# Patient Record
Sex: Female | Born: 1955 | Race: White | Hispanic: No | Marital: Single | Smoking: Former smoker
Health system: Southern US, Community
[De-identification: ages and names within clinical notes are randomized; demographics above are authoritative.]

## PROBLEM LIST (undated history)

## (undated) DIAGNOSIS — E119 Type 2 diabetes mellitus without complications: Secondary | ICD-10-CM

## (undated) DIAGNOSIS — F419 Anxiety disorder, unspecified: Secondary | ICD-10-CM

## (undated) DIAGNOSIS — E785 Hyperlipidemia, unspecified: Secondary | ICD-10-CM

## (undated) DIAGNOSIS — T7840XA Allergy, unspecified, initial encounter: Secondary | ICD-10-CM

## (undated) HISTORY — DX: Hyperlipidemia, unspecified: E78.5

## (undated) HISTORY — DX: Type 2 diabetes mellitus without complications: E11.9

## (undated) HISTORY — PX: COLONOSCOPY: SHX5424

## (undated) HISTORY — DX: Allergy, unspecified, initial encounter: T78.40XA

## (undated) HISTORY — DX: Anxiety disorder, unspecified: F41.9

## (undated) HISTORY — PX: WISDOM TOOTH EXTRACTION: SHX21

---
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
## 1999-12-17 ENCOUNTER — Encounter: Admission: RE | Admit: 1999-12-17 | Discharge: 1999-12-17 | Payer: Self-pay | Admitting: Obstetrics and Gynecology

## 1999-12-17 ENCOUNTER — Encounter: Payer: Self-pay | Admitting: Obstetrics and Gynecology

## 2000-07-31 ENCOUNTER — Inpatient Hospital Stay (HOSPITAL_COMMUNITY): Admission: AD | Admit: 2000-07-31 | Discharge: 2000-07-31 | Payer: Self-pay | Admitting: Obstetrics and Gynecology

## 2003-02-15 ENCOUNTER — Encounter: Payer: Self-pay | Admitting: Obstetrics and Gynecology

## 2003-02-15 ENCOUNTER — Encounter: Admission: RE | Admit: 2003-02-15 | Discharge: 2003-02-15 | Payer: Self-pay | Admitting: Obstetrics and Gynecology

## 2004-02-28 ENCOUNTER — Encounter: Admission: RE | Admit: 2004-02-28 | Discharge: 2004-02-28 | Payer: Self-pay | Admitting: Obstetrics and Gynecology

## 2006-06-15 ENCOUNTER — Encounter: Admission: RE | Admit: 2006-06-15 | Discharge: 2006-06-15 | Payer: Self-pay | Admitting: Obstetrics and Gynecology

## 2007-10-26 ENCOUNTER — Encounter: Admission: RE | Admit: 2007-10-26 | Discharge: 2007-10-26 | Payer: Self-pay | Admitting: Obstetrics and Gynecology

## 2009-07-22 IMAGING — MG MM SCREEN MAMMOGRAM BILATERAL
4 series · 4 of 4 positions shown · non-contrast
Comparison: none

DG SCREEN MAMMOGRAM BILATERAL
Bilateral CC and MLO view(s) were taken.

DIGITAL SCREENING MAMMOGRAM WITH CAD:
There are scattered fibroglandular densities.  No masses or malignant type calcifications are 
identified.  Compared with prior studies.

[R CC]
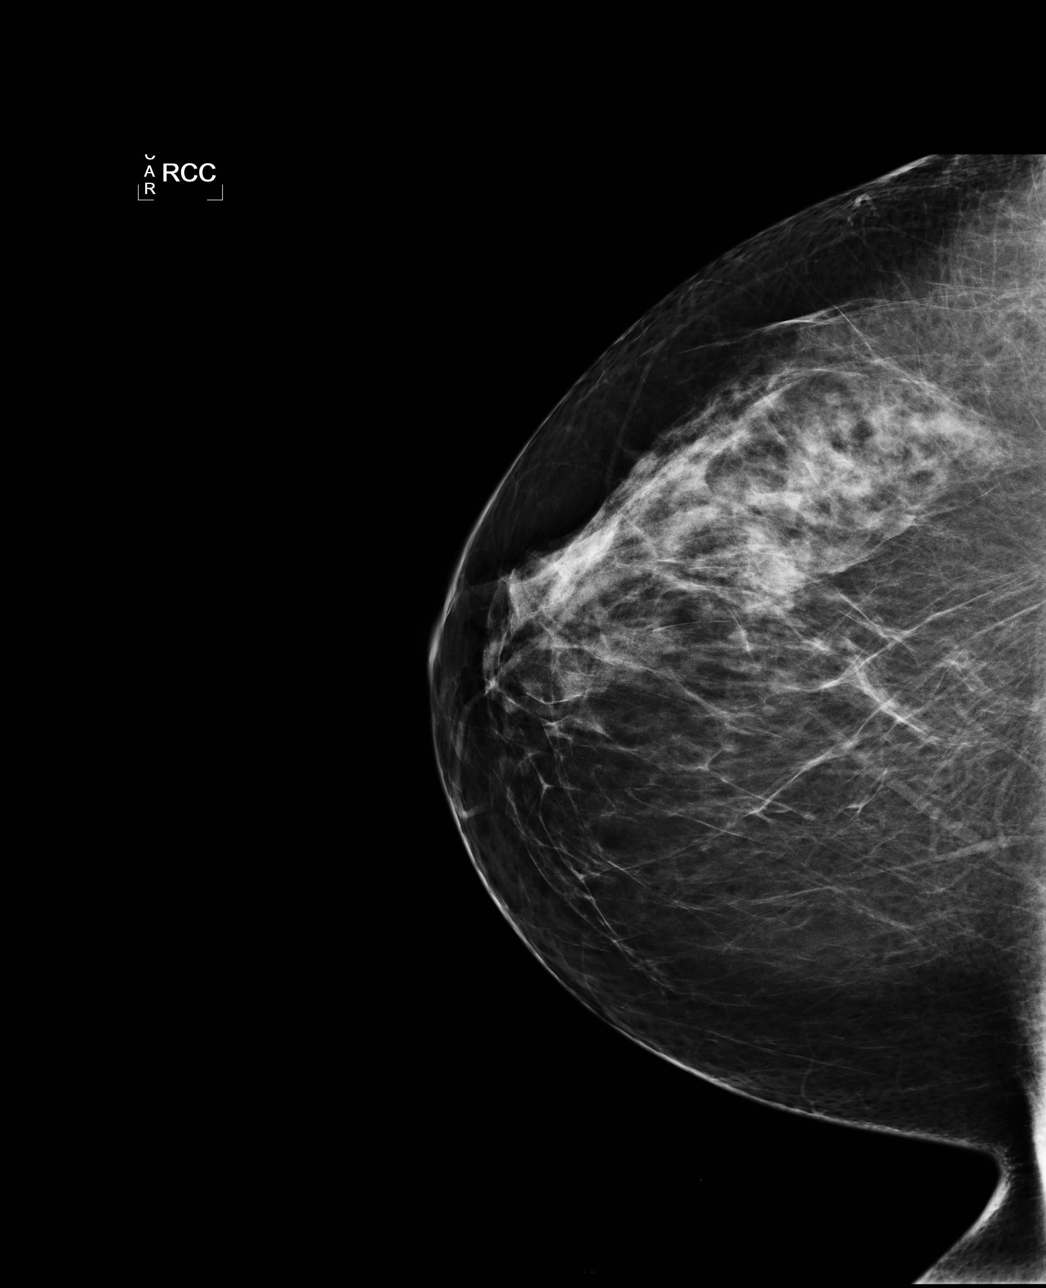

[L CC]
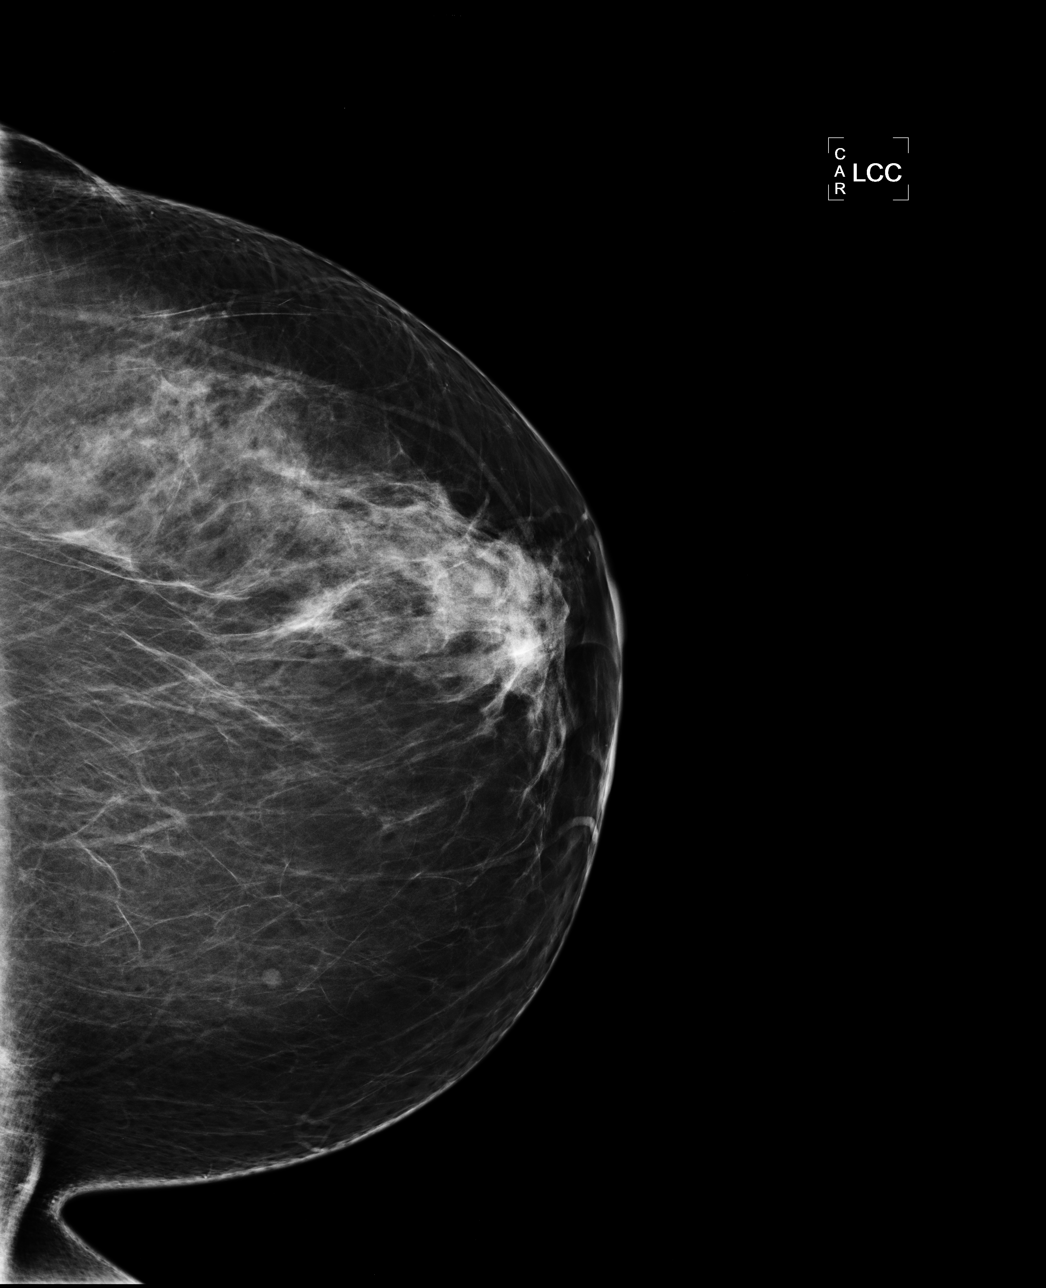

[L MLO]
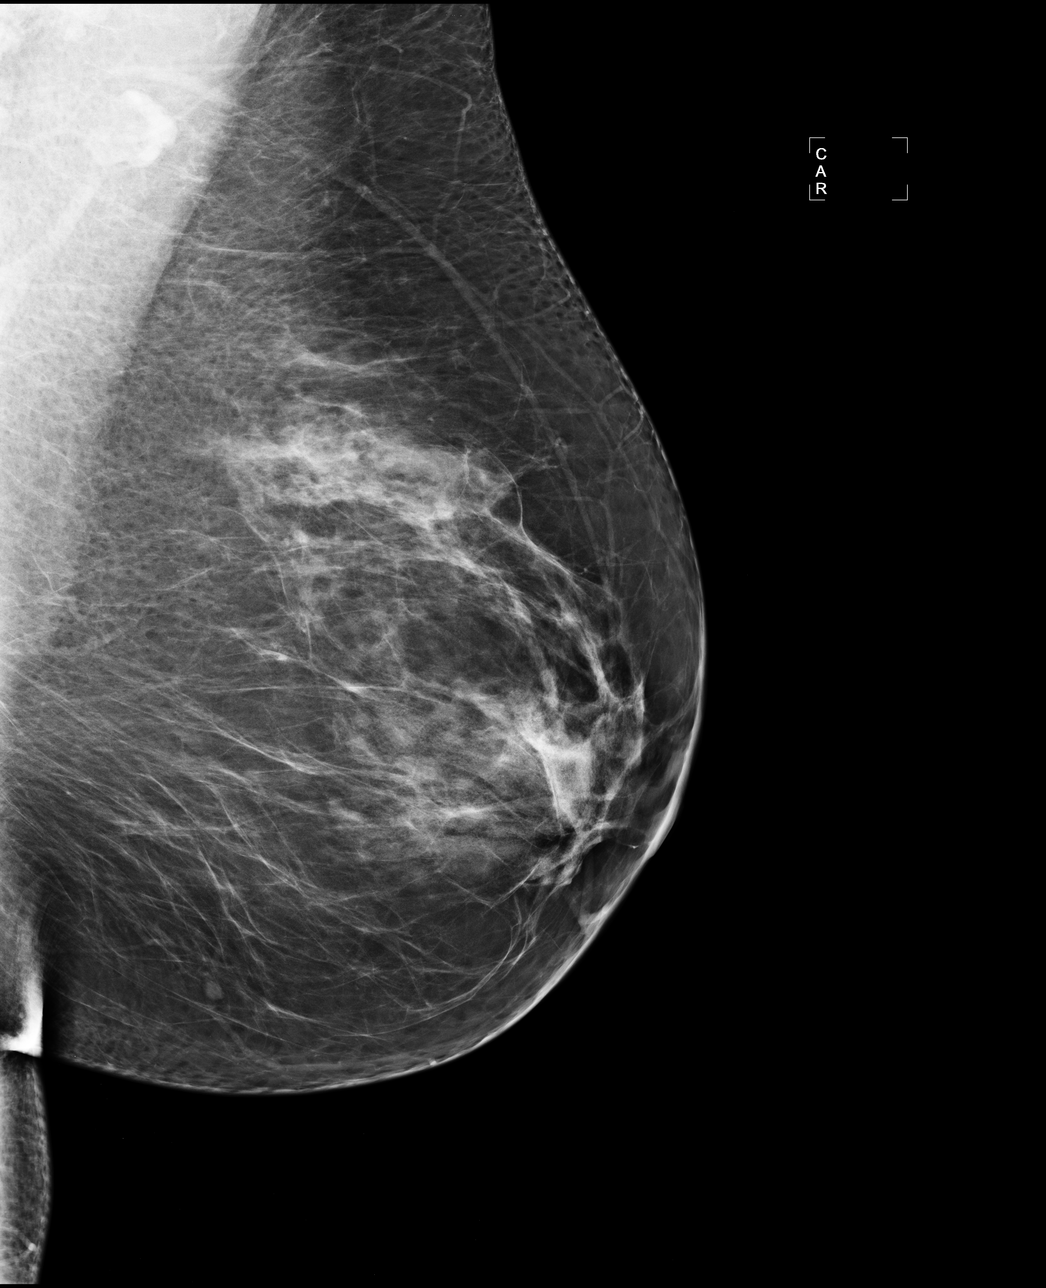

[R MLO]
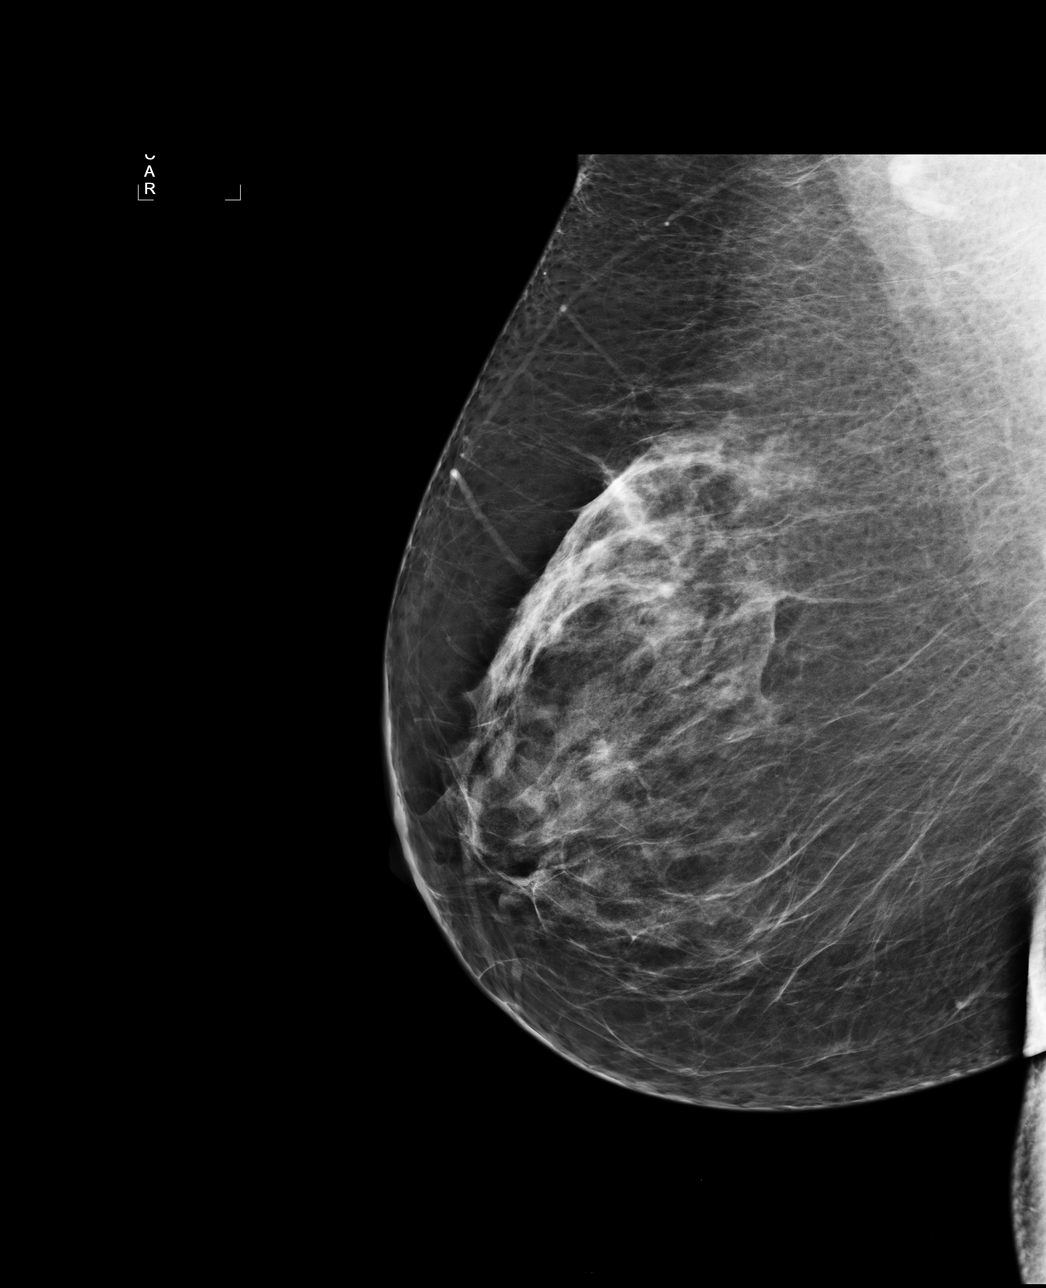

[4 of 4 positions shown; findings below may reference images not displayed]

IMPRESSION: No specific mammographic evidence of malignancy.  Next screening mammogram is recommended in one 
year.

ASSESSMENT: Negative - BI-RADS 1

Screening mammogram in 1 year.
ANALYZED BY COMPUTER AIDED DETECTION. , THIS PROCEDURE WAS A DIGITAL MAMMOGRAM.

## 2012-07-13 ENCOUNTER — Ambulatory Visit (AMBULATORY_SURGERY_CENTER): Payer: 59 | Admitting: *Deleted

## 2012-07-13 VITALS — Ht 63.0 in | Wt 191.7 lb

## 2012-07-13 DIAGNOSIS — Z1211 Encounter for screening for malignant neoplasm of colon: Secondary | ICD-10-CM

## 2012-07-13 MED ORDER — MOVIPREP 100 G PO SOLR: 1.0000 | Freq: Once | ORAL | Status: DC

## 2012-07-19 ENCOUNTER — Ambulatory Visit (AMBULATORY_SURGERY_CENTER): Payer: 59 | Admitting: Gastroenterology

## 2012-07-19 ENCOUNTER — Encounter: Payer: Self-pay | Admitting: Gastroenterology

## 2012-07-19 VITALS — BP 115/74 | HR 63 | Temp 98.4°F | Resp 15 | Ht 63.0 in | Wt 191.0 lb

## 2012-07-19 DIAGNOSIS — Z1282 Encounter for screening for malignant neoplasm of nervous system: Secondary | ICD-10-CM

## 2012-07-19 DIAGNOSIS — K5730 Diverticulosis of large intestine without perforation or abscess without bleeding: Secondary | ICD-10-CM

## 2012-07-19 MED ORDER — SODIUM CHLORIDE 0.9 % IV SOLN: 500.0000 mL | INTRAVENOUS | Status: DC

## 2012-07-19 NOTE — Op Note (Signed)
[facility] [address] [place], [address]   COLONOSCOPY PROCEDURE REPORT  PATIENT: Theresa Crane, Theresa Crane  MR#: [id number] BIRTHDATE: Feb 17, 1956 , 56  yrs. old GENDER: Female ENDOSCOPIST: Rachael Fee, MD REFERRED [id number] Vincente Poli, M.D. PROCEDURE DATE:  07/19/2012 PROCEDURE:   Colonoscopy, diagnostic ASA CLASS:   Class II INDICATIONS:average risk screening. MEDICATIONS: Fentanyl 50 mcg IV, Versed 7 mg IV, and These medications were titrated to patient response per physician's verbal order  DESCRIPTION OF PROCEDURE:   After the risks benefits and alternatives of the procedure were thoroughly explained, informed consent was obtained.  A digital rectal exam revealed no abnormalities of the rectum.   The LB [id number]-[id number]  endoscope was introduced through the anus and advanced to the cecum, which was identified by both the appendix and ileocecal valve. No adverse events experienced.   The quality of the prep was good, using MoviPrep  The instrument was then slowly withdrawn as the colon was fully examined.    COLON FINDINGS: There were multiple diverticulum throughout the colon.  The examination was otherwise normal.  Retroflexed views revealed no abnormalities. The time to cecum=3 minutes 00 seconds. Withdrawal time=10 minutes 35 seconds.  The scope was withdrawn and the procedure completed. COMPLICATIONS: There were no complications.  ENDOSCOPIC IMPRESSION: There were multiple diverticulum throughout the colon. The examination was otherwise normal.  No polyps or cancers.  RECOMMENDATIONS: You should continue to follow colorectal cancer screening guidelines for "routine risk" patients with a repeat colonoscopy in 10 years. There is no need for FOBT (stool) testing for at least 5 years.   eSigned:  Rachael Fee, MD 07/19/2012 2:01 PM

## 2012-07-19 NOTE — Progress Notes (Signed)
Patient did not experience any of the following events: a burn prior to discharge; a fall within the facility; wrong site/side/patient/procedure/implant event; or a hospital transfer or hospital admission upon discharge from the facility. (G8907) Patient did not have preoperative order for IV antibiotic SSI prophylaxis. (G8918)  

## 2012-07-19 NOTE — Patient Instructions (Addendum)
YOU HAD AN ENDOSCOPIC PROCEDURE TODAY AT THE [facility]: Refer to the procedure report that was given to you for any specific questions about what was found during the examination.  If the procedure report does not answer your questions, please call your gastroenterologist to clarify.  If you requested that your care partner not be given the details of your procedure findings, then the procedure report has been included in a sealed envelope for you to review at your convenience later.  YOU SHOULD EXPECT: Some feelings of bloating in the abdomen. Passage of more gas than usual.  Walking can help get rid of the air that was put into your GI tract during the procedure and reduce the bloating. If you had a lower endoscopy (such as a colonoscopy or flexible sigmoidoscopy) you may notice spotting of blood in your stool or on the toilet paper. If you underwent a bowel prep for your procedure, then you may not have a normal bowel movement for a few days.  DIET: Your first meal following the procedure should be a light meal and then it is ok to progress to your normal diet.  A half-sandwich or bowl of soup is an example of a good first meal.  Heavy or fried foods are harder to digest and may make you feel nauseous or bloated.  Likewise meals heavy in dairy and vegetables can cause extra gas to form and this can also increase the bloating.  Drink plenty of fluids but you should avoid alcoholic beverages for 24 hours.  ACTIVITY: Your care partner should take you home directly after the procedure.  You should plan to take it easy, moving slowly for the rest of the day.  You can resume normal activity the day after the procedure however you should NOT DRIVE or use heavy machinery for 24 hours (because of the sedation medicines used during the test).    SYMPTOMS TO REPORT IMMEDIATELY: A gastroenterologist can be reached at any hour.  During normal business hours, 8:30 AM to 5:00 PM Monday through Friday,  call [phone].  After hours and on weekends, please call the GI answering service at [phone] who will take a message and have the physician on call contact you.   Following lower endoscopy (colonoscopy or flexible sigmoidoscopy):  Excessive amounts of blood in the stool  Significant tenderness or worsening of abdominal pains  Swelling of the abdomen that is new, acute  Fever of 100F or higher   FOLLOW UP: If any biopsies were taken you will be contacted by phone or by letter within the next 1-3 weeks.  Call your gastroenterologist if you have not heard about the biopsies in 3 weeks.  Our staff will call the home number listed on your records the next business day following your procedure to check on you and address any questions or concerns that you may have at that time regarding the information given to you following your procedure. This is a courtesy call and so if there is no answer at the home number and we have not heard from you through the emergency physician on call, we will assume that you have returned to your regular daily activities without incident.  SIGNATURES/CONFIDENTIALITY: You and/or your care partner have signed paperwork which will be entered into your electronic medical record.  These signatures attest to the fact that that the information above on your After Visit Summary has been reviewed and is understood.  Full responsibility of the confidentiality of   this discharge information lies with you and/or your care-partner.   Resume medications. Information given on diverticulosis and high fiber with discharge instructions. 

## 2012-07-20 ENCOUNTER — Telehealth: Payer: Self-pay | Admitting: *Deleted

## 2012-07-20 NOTE — Telephone Encounter (Signed)
  Follow up Call-  Call back number 07/19/2012  Post procedure Call Back phone  # [phone]  Permission to leave phone message Yes     Left message on answering machine to call us back if experiencing problems or has any questions.

## 2016-01-15 ENCOUNTER — Ambulatory Visit: Payer: 59 | Admitting: *Deleted

## 2017-08-10 ENCOUNTER — Ambulatory Visit: Payer: 59 | Admitting: Endocrinology

## 2018-03-03 ENCOUNTER — Ambulatory Visit: Payer: BLUE CROSS/BLUE SHIELD | Admitting: Podiatry

## 2018-03-03 ENCOUNTER — Encounter: Payer: Self-pay | Admitting: Podiatry

## 2018-03-03 ENCOUNTER — Ambulatory Visit (INDEPENDENT_AMBULATORY_CARE_PROVIDER_SITE_OTHER): Payer: BLUE CROSS/BLUE SHIELD

## 2018-03-03 DIAGNOSIS — M7751 Other enthesopathy of right foot: Secondary | ICD-10-CM | POA: Diagnosis not present

## 2018-03-03 DIAGNOSIS — M779 Enthesopathy, unspecified: Secondary | ICD-10-CM

## 2018-03-03 DIAGNOSIS — M7752 Other enthesopathy of left foot: Secondary | ICD-10-CM | POA: Diagnosis not present

## 2018-03-03 DIAGNOSIS — M898X9 Other specified disorders of bone, unspecified site: Secondary | ICD-10-CM

## 2018-03-03 DIAGNOSIS — E119 Type 2 diabetes mellitus without complications: Secondary | ICD-10-CM | POA: Insufficient documentation

## 2018-03-03 DIAGNOSIS — E559 Vitamin D deficiency, unspecified: Secondary | ICD-10-CM | POA: Insufficient documentation

## 2018-03-03 MED ORDER — MELOXICAM 7.5 MG PO TABS: 7.5000 mg | ORAL_TABLET | Freq: Every day | ORAL | 0 refills | Status: DC

## 2018-03-03 NOTE — Patient Instructions (Signed)
You can use "fungi-nail" on your toenails  Look at getting a "powerstep" or "superfeet" insert

## 2018-03-03 NOTE — Progress Notes (Signed)
Subjective:    Patient ID: Theresa Crane, female    DOB: 1956-07-29, 62 y.o.   MRN: [id number]  HPI Ms. Tiburcio PeaHarris presents the office today for concerns of pain to the also aspect of the right foot.  She states that she has had some pain to the area for the last 2 months.  She states that she feels that there is been something sliding in the area and she feels that she may have broken something previously.  She states that hurts with certain shoes and pressure.  She has noticed some mild swelling to the outside aspect of the right foot.  Denies any redness or warmth.  She does have a history of a remote ankle injury in the left side which will occasionally cause some discomfort.  She states when she wears a shoe with good arch support she does not have pain.   Review of Systems  All other systems reviewed and are negative.  Past Medical History:  Diagnosis Date  . Anxiety   . Diabetes mellitus without complication (HCC)   . Hyperlipidemia     Past Surgical History:  Procedure Laterality Date  . eye operation  1962  . WISDOM TOOTH EXTRACTION       Current Outpatient Medications:  .  lisinopril (PRINIVIL,ZESTRIL) 5 MG tablet, Take 5 mg by mouth daily., Disp: , Rfl:  .  meloxicam (MOBIC) 7.5 MG tablet, Take 1 tablet (7.5 mg total) by mouth daily., Disp: 30 tablet, Rfl: 0 .  metFORMIN (GLUCOPHAGE) 500 MG tablet, Take 500 mg by mouth 2 (two) times daily with a meal., Disp: , Rfl:  .  Multiple Vitamins-Minerals (MULTIVITAMIN PO), Take 1 capsule by mouth daily., Disp: , Rfl:  .  norethindrone-ethinyl estradiol (TRIPHASIL,CYCLAFEM,ALYACEN) 0.5/0.75/1-35 MG-MCG tablet, Take 1 tablet by mouth daily., Disp: , Rfl:  .  rosuvastatin (CRESTOR) 20 MG tablet, Take 20 mg by mouth daily., Disp: , Rfl:  .  sertraline (ZOLOFT) 100 MG tablet, Take 100 mg by mouth daily., Disp: , Rfl:   No Known Allergies      Objective:   Physical Exam  General: AAO x3, NAD  Dermatological: Bilateral hallux  toenails are hypertrophic and mildly dystrophic with yellow-brown discoloration but there is no pain in the nails or any redness or drainage.  There are no open sores, no preulcerative lesions, no rash or signs of infection present.  Vascular: Dorsalis Pedis artery and Posterior Tibial artery pedal pulses are 2/4 bilateral with immedate capillary fill time.There is no pain with calf compression, swelling, warmth, erythema.   Neruologic: Grossly intact via light touch bilateral. Vibratory intact via tuning fork bilateral. Protective threshold with Semmes Wienstein monofilament intact to all pedal sites bilateral.   Musculoskeletal: On the base of the right fifth metatarsal laterally is a bony elements but there is localized edema without any erythema or increase in warmth.  There is no significant discomfort on the course the peroneal tendon.  There is no pain to vibratory sensation.  No other areas of tenderness on the right foot.  Range of motion intact.  On the left ankle there is some bony prominence along the medial aspect of the ankle where she occasionally gets some discomfort but currently denies any pain.  There is no edema, erythema, increase in warmth.  Muscular strength 5/5 in all groups tested bilateral.  Gait: Unassisted, Nonantalgic.      Assessment & Plan:  62 year old female with right fifth metatarsal bony exostosis, tendinitis ; onychomycosis -Treatment  options discussed including all alternatives, risks, and complications -Etiology of symptoms were discussed -X-rays were obtained and reviewed with the patient.  On the lateral aspect the fifth metatarsal base is a bony exostosis likely from a prior injury.  This corresponds to her area pain.  There is no other evidence of acute fracture identified bilaterally. -Discussed a steroid injection but she declines this. -We discussed the change in shoes and wearing good arch supports if this is helpful. -Prescribed mobic. Discussed side  effects of the medication and directed to stop if any are to occur and call the office.  -Ice daily -Discuss Fungi-Nail for toenails.  Also discussed other options and will start with over-the-counter medicine topical medication. -Follow-up in 6-8 weeks if symptoms continue or sooner if any issues are to arise.   Vivi Barrack DPM

## 2018-03-04 ENCOUNTER — Telehealth: Payer: Self-pay | Admitting: Podiatry

## 2018-03-04 NOTE — Telephone Encounter (Signed)
Pt is wondering if she can get a boot or something. She is in a lot of pain and needs something to take the pressure.

## 2018-03-04 NOTE — Telephone Encounter (Signed)
Left message for pt to pick up a surgery sandal tomorrow at 8:00am, and we would bill her insurance, and if she wanted to wait until Monday she could wear a stiff bottom shoe with open toes to relieve any pressure or rubbing.

## 2018-04-14 ENCOUNTER — Other Ambulatory Visit: Payer: Self-pay | Admitting: Podiatry

## 2018-05-13 ENCOUNTER — Other Ambulatory Visit: Payer: Self-pay | Admitting: Podiatry

## 2019-12-21 ENCOUNTER — Ambulatory Visit: Payer: Self-pay | Admitting: Gynecologic Oncology

## 2020-01-05 ENCOUNTER — Ambulatory Visit: Payer: Self-pay | Admitting: Gynecologic Oncology

## 2023-01-15 ENCOUNTER — Encounter: Payer: Self-pay | Admitting: Internal Medicine

## 2023-02-03 ENCOUNTER — Telehealth: Payer: Self-pay | Admitting: Internal Medicine

## 2023-02-03 NOTE — Telephone Encounter (Signed)
Inbound call from patient to let us know she wont be able to take the phone call at 2:00 ...  Appointment was reschedule.

## 2023-02-03 NOTE — Telephone Encounter (Signed)
Thanks. Ill make [facility] aware.

## 2023-02-11 ENCOUNTER — Other Ambulatory Visit: Payer: Self-pay

## 2023-02-11 ENCOUNTER — Ambulatory Visit: Payer: No Typology Code available for payment source

## 2023-02-11 VITALS — Ht 63.0 in | Wt 185.0 lb

## 2023-02-11 DIAGNOSIS — Z1211 Encounter for screening for malignant neoplasm of colon: Secondary | ICD-10-CM

## 2023-02-11 MED ORDER — NA SULFATE-K SULFATE-MG SULF 17.5-3.13-1.6 GM/177ML PO SOLN
1.0000 | Freq: Once | ORAL | 0 refills | Status: AC
Start: 2023-02-11 — End: 2023-02-11

## 2023-02-11 NOTE — Progress Notes (Signed)
Denies allergies to eggs or soy products. Denies complication of anesthesia or sedation. Denies use of weight loss medication. Denies use of O2.   Emmi instructions given for colonoscopy.  

## 2023-02-18 ENCOUNTER — Encounter: Payer: Self-pay | Admitting: Internal Medicine

## 2023-02-25 ENCOUNTER — Encounter: Payer: Self-pay | Admitting: Certified Registered Nurse Anesthetist

## 2023-03-03 ENCOUNTER — Ambulatory Visit: Payer: No Typology Code available for payment source | Admitting: Internal Medicine

## 2023-03-03 ENCOUNTER — Encounter: Payer: Self-pay | Admitting: Internal Medicine

## 2023-03-03 VITALS — BP 105/69 | HR 74 | Temp 98.4°F | Resp 12 | Ht 63.0 in | Wt 185.0 lb

## 2023-03-03 DIAGNOSIS — Z1211 Encounter for screening for malignant neoplasm of colon: Secondary | ICD-10-CM | POA: Diagnosis present

## 2023-03-03 MED ORDER — SODIUM CHLORIDE 0.9 % IV SOLN
500.0000 mL | Freq: Once | INTRAVENOUS | Status: DC
Start: 2023-03-03 — End: 2023-03-03

## 2023-03-03 NOTE — Patient Instructions (Addendum)
I am pleased to report that no polyps or cancer were seen.  You still have diverticulosis - thickened muscle rings and pouches in the colon wall. Please read the handout about this condition.  Next routine colonoscopy or other screening test in 10 years - 2034.  I appreciate the opportunity to care for you. Iva Boop, MD, [facility]  Handouts Provided:  Diverticulosis  YOU HAD AN ENDOSCOPIC PROCEDURE TODAY AT THE [facility]:   Refer to the procedure report that was given to you for any specific questions about what was found during the examination.  If the procedure report does not answer your questions, please call your gastroenterologist to clarify.  If you requested that your care partner not be given the details of your procedure findings, then the procedure report has been included in a sealed envelope for you to review at your convenience later.  YOU SHOULD EXPECT: Some feelings of bloating in the abdomen. Passage of more gas than usual.  Walking can help get rid of the air that was put into your GI tract during the procedure and reduce the bloating. If you had a lower endoscopy (such as a colonoscopy or flexible sigmoidoscopy) you may notice spotting of blood in your stool or on the toilet paper. If you underwent a bowel prep for your procedure, you may not have a normal bowel movement for a few days.  Please Note:  You might notice some irritation and congestion in your nose or some drainage.  This is from the oxygen used during your procedure.  There is no need for concern and it should clear up in a day or so.  SYMPTOMS TO REPORT IMMEDIATELY:  Following lower endoscopy (colonoscopy or flexible sigmoidoscopy):  Excessive amounts of blood in the stool  Significant tenderness or worsening of abdominal pains  Swelling of the abdomen that is new, acute  Fever of 100F or higher  For urgent or emergent issues, a gastroenterologist can be reached at any hour by calling [phone]  [phone]. Do not use MyChart messaging for urgent concerns.    DIET:  We do recommend a small meal at first, but then you may proceed to your regular diet.  Drink plenty of fluids but you should avoid alcoholic beverages for 24 hours.  ACTIVITY:  You should plan to take it easy for the rest of today and you should NOT DRIVE or use heavy machinery until tomorrow (because of the sedation medicines used during the test).    FOLLOW UP: Our staff will call the number listed on your records the next business day following your procedure.  We will call around 7:15- 8:00 am to check on you and address any questions or concerns that you may have regarding the information given to you following your procedure. If we do not reach you, we will leave a message.     If any biopsies were taken you will be contacted by phone or by letter within the next 1-3 weeks.  Please call us at [phone] if you have not heard about the biopsies in 3 weeks.    SIGNATURES/CONFIDENTIALITY: You and/or your care partner have signed paperwork which will be entered into your electronic medical record.  These signatures attest to the fact that that the information above on your After Visit Summary has been reviewed and is understood.  Full responsibility of the confidentiality of this discharge information lies with you and/or your care-partner.

## 2023-03-03 NOTE — Progress Notes (Signed)
[facility] History and Physical   Primary Care Physician:  Marcelle Overlie, MD   Reason for Procedure:   CRCa screening  Plan:    colonoscopy     HPI: Theresa Crane is a 67 y.o. female here for repeat screening exam, negative colonoscopy 2013.   Past Medical History:  Diagnosis Date   Allergy    Anxiety    Diabetes mellitus without complication (HCC)    Hyperlipidemia     Past Surgical History:  Procedure Laterality Date   eye operation  1962   WISDOM TOOTH EXTRACTION      Prior to Admission medications   Medication Sig Start Date End Date Taking? Authorizing Provider  empagliflozin (JARDIANCE) 25 MG TABS tablet Take 25 mg by mouth daily.   Yes [provider]  lisinopril (PRINIVIL,ZESTRIL) 5 MG tablet Take 5 mg by mouth daily.   Yes [provider]  metFORMIN (GLUCOPHAGE) 500 MG tablet Take 500 mg by mouth 2 (two) times daily with a meal.   Yes [provider]  pioglitazone (ACTOS) 15 MG tablet Take 15 mg by mouth daily.   Yes [provider]  rosuvastatin (CRESTOR) 20 MG tablet Take 20 mg by mouth daily.   Yes [provider]  sertraline (ZOLOFT) 100 MG tablet Take 100 mg by mouth daily.   Yes [provider]  Multiple Vitamins-Minerals (MULTIVITAMIN PO) Take 1 capsule by mouth daily.    [provider]    Current Outpatient Medications  Medication Sig Dispense Refill   empagliflozin (JARDIANCE) 25 MG TABS tablet Take 25 mg by mouth daily.     lisinopril (PRINIVIL,ZESTRIL) 5 MG tablet Take 5 mg by mouth daily.     metFORMIN (GLUCOPHAGE) 500 MG tablet Take 500 mg by mouth 2 (two) times daily with a meal.     pioglitazone (ACTOS) 15 MG tablet Take 15 mg by mouth daily.     rosuvastatin (CRESTOR) 20 MG tablet Take 20 mg by mouth daily.     sertraline (ZOLOFT) 100 MG tablet Take 100 mg by mouth daily.     Multiple Vitamins-Minerals (MULTIVITAMIN PO) Take 1 capsule by mouth daily.      Current Facility-Administered Medications  Medication Dose Route Frequency Provider Last Rate Last Admin   0.9 %  sodium chloride infusion  500 mL Intravenous Once Iva Boop, MD        Allergies as of 03/03/2023 - Review Complete 03/03/2023  Allergen Reaction Noted   Dulaglutide  02/05/2023   Sitagliptin  12/11/2019   Sitagliptin-metformin hcl Nausea And Vomiting 02/05/2023    Family History  Adopted: Yes    Social History   Socioeconomic History   Marital status: Single    Spouse name: Not on file   Number of children: Not on file   Years of education: Not on file   Highest education level: Not on file  Occupational History   Not on file  Tobacco Use   Smoking status: Former   Smokeless tobacco: Never  Substance and Sexual Activity   Alcohol use: Yes    Alcohol/week: 1.0 standard drink of alcohol    Types: 1 Glasses of wine per week    Comment: rare drink   Drug use: No   Sexual activity: Not on file  Other Topics Concern   Not on file  Social History Narrative   Not on file   Social Determinants of Health   Financial Resource Strain: Not on file  Food Insecurity:  Not on file  Transportation Needs: Not on file  Physical Activity: Not on file  Stress: Not on file  Social Connections: Not on file  Intimate Partner Violence: Not on file    Review of Systems:  All other review of systems negative except as mentioned in the HPI.  Physical Exam: Vital signs BP 135/65   Pulse 86   Temp 98.4 F (36.9 C)   Ht 5\' 3"  (1.6 m)   Wt 185 lb (83.9 kg)   LMP 06/19/2012   SpO2 96%   BMI 32.77 kg/m   General:   Alert,  Well-developed, well-nourished, pleasant and cooperative in NAD Lungs:  Clear throughout to auscultation.   Heart:  Regular rate and rhythm; no murmurs, clicks, rubs,  or gallops. Abdomen:  Soft, nontender and nondistended. Normal bowel sounds.   Neuro/Psych:  Alert and cooperative. Normal mood and affect. A and O x 3   @Demika Langenderfer  Sena Slate, MD, [facility] [phone] (pager) 03/03/2023 10:54 AM@

## 2023-03-03 NOTE — Op Note (Signed)
[facility] Patient Name: Theresa Crane Procedure Date: 03/03/2023 10:47 AM MRN: [id number] Endoscopist: Iva Boop , MD, [id number] Age: 67 Referring MD:  Date of Birth: 06-14-1956 Gender: Female Account #: [id number] Procedure:                Colonoscopy Indications:              Screening for colorectal malignant neoplasm, Last                            colonoscopy: 2013 Medicines:                Monitored Anesthesia Care Procedure:                Pre-Anesthesia Assessment:                           - Prior to the procedure, a History and Physical                            was performed, and patient medications and                            allergies were reviewed. The patient's tolerance of                            previous anesthesia was also reviewed. The risks                            and benefits of the procedure and the sedation                            options and risks were discussed with the patient.                            All questions were answered, and informed consent                            was obtained. Prior Anticoagulants: The patient has                            taken no anticoagulant or antiplatelet agents. ASA                            Grade Assessment: II - A patient with mild systemic                            disease. After reviewing the risks and benefits,                            the patient was deemed in satisfactory condition to                            undergo the procedure.  After obtaining informed consent, the colonoscope                            was passed under direct vision. Throughout the                            procedure, the patient's blood pressure, pulse, and                            oxygen saturations were monitored continuously. The                            PCF-[id number] Colonoscope [id number] was introduced                            through the anus and advanced to the the  cecum,                            identified by appendiceal orifice and ileocecal                            valve. The colonoscopy was performed without                            difficulty. The patient tolerated the procedure                            well. The quality of the bowel preparation was                            good. The ileocecal valve, appendiceal orifice, and                            rectum were photographed. The bowel preparation                            used was SUPREP via split dose instruction. Scope In: 11:03:06 AM Scope Out: 11:15:45 AM Scope Withdrawal Time: 0 hours 8 minutes 57 seconds  Total Procedure Duration: 0 hours 12 minutes 39 seconds  Findings:                 The perianal and digital rectal examinations were                            normal.                           Multiple diverticula were found in the sigmoid                            colon, descending colon and transverse colon. There                            was narrowing of the colon in association with the  diverticular opening.                           The exam was otherwise without abnormality on                            direct and retroflexion views. Complications:            No immediate complications. Estimated Blood Loss:     Estimated blood loss: none. Impression:               - Severe diverticulosis in the sigmoid colon, in                            the descending colon and in the transverse colon.                            There was narrowing of the colon in association                            with the diverticular opening.                           - The examination was otherwise normal on direct                            and retroflexion views.                           - No specimens collected. Recommendation:           - Patient has a contact number available for                            emergencies. The signs and symptoms of  potential                            delayed complications were discussed with the                            patient. Return to normal activities tomorrow.                            Written discharge instructions were provided to the                            patient.                           - Resume previous diet.                           - Continue present medications.                           - Repeat colonoscopy in 10 years for screening  purposes. Iva Boop, MD 03/03/2023 11:20:12 AM This report has been signed electronically.

## 2023-03-03 NOTE — Progress Notes (Signed)
Pt's states no medical or surgical changes since previsit or office visit. 

## 2023-03-03 NOTE — Progress Notes (Signed)
Report given to PACU, vss 

## 2023-03-04 ENCOUNTER — Telehealth: Payer: Self-pay

## 2023-03-04 NOTE — Telephone Encounter (Signed)
No answer, left message to call if having any issues or concerns, B.Ancel Easler RN
